# Patient Record
Sex: Male | Born: 1975 | Race: White | Hispanic: No | Marital: Married | Smoking: Never smoker
Health system: Southern US, Community
[De-identification: ages and names within clinical notes are randomized; demographics above are authoritative.]

## PROBLEM LIST (undated history)

## (undated) HISTORY — PX: NO PAST SURGERIES: SHX2092

---

## 2007-12-29 ENCOUNTER — Ambulatory Visit: Payer: Self-pay | Admitting: Internal Medicine

## 2015-08-13 ENCOUNTER — Ambulatory Visit
Admission: EM | Admit: 2015-08-13 | Discharge: 2015-08-13 | Disposition: A | Discharge status: Home / Self Care | Payer: BC Managed Care – PPO | Attending: Family Medicine | Admitting: Family Medicine

## 2015-08-13 ENCOUNTER — Ambulatory Visit: Payer: BC Managed Care – PPO

## 2015-08-13 ENCOUNTER — Encounter: Payer: Self-pay | Admitting: Emergency Medicine

## 2015-08-13 DIAGNOSIS — S61411A Laceration without foreign body of right hand, initial encounter: Secondary | ICD-10-CM

## 2015-08-13 MED ORDER — TETANUS-DIPHTH-ACELL PERTUSSIS 5-2.5-18.5 LF-MCG/0.5 IM SUSP
0.5000 mL | Freq: Once | INTRAMUSCULAR | Status: AC
  Administered 2015-08-13: 0.5 mL via INTRAMUSCULAR

## 2015-08-13 NOTE — Discharge Instructions (Signed)
Laceration Care, Adult °A laceration is a cut or lesion that goes through all layers of the skin and into the tissue just beneath the skin. °TREATMENT  °Some lacerations may not require closure. Some lacerations may not be able to be closed due to an increased risk of infection. It is important to see your caregiver as soon as possible after an injury to minimize the risk of infection and maximize the opportunity for successful closure. °If closure is appropriate, pain medicines may be given, if needed. The wound will be cleaned to help prevent infection. Your caregiver will use stitches (sutures), staples, wound glue (adhesive), or skin adhesive strips to repair the laceration. These tools bring the skin edges together to allow for faster healing and a better cosmetic outcome. However, all wounds will heal with a scar. Once the wound has healed, scarring can be minimized by covering the wound with sunscreen during the day for 1 full year. °HOME CARE INSTRUCTIONS  °For sutures or staples: °· Keep the wound clean and dry. °· If you were given a bandage (dressing), you should change it at least once a day. Also, change the dressing if it becomes wet or dirty, or as directed by your caregiver. °· Wash the wound with soap and water 2 times a day. Rinse the wound off with water to remove all soap. Pat the wound dry with a clean towel. °· After cleaning, apply a thin layer of the antibiotic ointment as recommended by your caregiver. This will help prevent infection and keep the dressing from sticking. °· You may shower as usual after the first 24 hours. Do not soak the wound in water until the sutures are removed. °· Only take over-the-counter or prescription medicines for pain, discomfort, or fever as directed by your caregiver. °· Get your sutures or staples removed as directed by your caregiver. °For skin adhesive strips: °· Keep the wound clean and dry. °· Do not get the skin adhesive strips wet. You may bathe  carefully, using caution to keep the wound dry. °· If the wound gets wet, pat it dry with a clean towel. °· Skin adhesive strips will fall off on their own. You may trim the strips as the wound heals. Do not remove skin adhesive strips that are still stuck to the wound. They will fall off in time. °For wound adhesive: °· You may briefly wet your wound in the shower or bath. Do not soak or scrub the wound. Do not swim. Avoid periods of heavy perspiration until the skin adhesive has fallen off on its own. After showering or bathing, gently pat the wound dry with a clean towel. °· Do not apply liquid medicine, cream medicine, or ointment medicine to your wound while the skin adhesive is in place. This may loosen the film before your wound is healed. °· If a dressing is placed over the wound, be careful not to apply tape directly over the skin adhesive. This may cause the adhesive to be pulled off before the wound is healed. °· Avoid prolonged exposure to sunlight or tanning lamps while the skin adhesive is in place. Exposure to ultraviolet light in the first year will darken the scar. °· The skin adhesive will usually remain in place for 5 to 10 days, then naturally fall off the skin. Do not pick at the adhesive film. °You may need a tetanus shot if: °· You cannot remember when you had your last tetanus shot. °· You have never had a tetanus   shot. If you get a tetanus shot, your arm may swell, get red, and feel warm to the touch. This is common and not a problem. If you need a tetanus shot and you choose not to have one, there is a rare chance of getting tetanus. Sickness from tetanus can be serious. SEEK MEDICAL CARE IF:   You have redness, swelling, or increasing pain in the wound.  You see a red line that goes away from the wound.  You have yellowish-white fluid (pus) coming from the wound.  You have a fever.  You notice a bad smell coming from the wound or dressing.  Your wound breaks open before or  after sutures have been removed.  You notice something coming out of the wound such as wood or glass.  Your wound is on your hand or foot and you cannot move a finger or toe. SEEK IMMEDIATE MEDICAL CARE IF:   Your pain is not controlled with prescribed medicine.  You have severe swelling around the wound causing pain and numbness or a change in color in your arm, hand, leg, or foot.  Your wound splits open and starts bleeding.  You have worsening numbness, weakness, or loss of function of any joint around or beyond the wound.  You develop painful lumps near the wound or on the skin anywhere on your body. MAKE SURE YOU:   Understand these instructions.  Will watch your condition.  Will get help right away if you are not doing well or get worse. Document Released: 12/03/2005 Document Revised: 02/25/2012 Document Reviewed: 05/29/2011 [facility] Patient Information 2015 [place], [place]. This information is not intended to replace advice given to you by your health care provider. Make sure you discuss any questions you have with your health care provider.   Follow up 7-10 days for suture removal

## 2015-08-13 NOTE — ED Notes (Signed)
Laceration on top of right hand. Was fixing dryer and was pulling wire underneath and something popped his hand.

## 2015-08-13 NOTE — ED Provider Notes (Signed)
CSN: [id number]     Arrival date & time 08/13/15  1328 History   First MD Initiated Contact with Patient 08/13/15 1422     Chief Complaint  Patient presents with  . Laceration   (Consider location/radiation/quality/duration/timing/severity/associated sxs/prior Treatment) HPI Comments: 39 yo male with a c/o a laceration to the top of his right hand. States was fixing dryer and cut his hand with a wire. States he can move his fingers. Denies numbness/tingling. Unsure of date of last tetanus vaccine. Otherwise healthy.  The history is provided by the patient.    History reviewed. No pertinent past medical history. Past Surgical History  Procedure Laterality Date  . No past surgeries     No family history on file. Social History  Substance Use Topics  . Smoking status: Never Smoker   . Smokeless tobacco: Never Used  . Alcohol Use: Yes     Comment: occasional    Review of Systems  Allergies  Review of patient's allergies indicates no known allergies.  Home Medications   Prior to Admission medications   Not on File   Meds Ordered and Administered this Visit   Medications  Tdap (BOOSTRIX) injection 0.5 mL (0.5 mLs Intramuscular Given 08/13/15 1442)    BP 123/92 mmHg  Pulse 62  Temp(Src) 97.9 F (36.6 C) (Tympanic)  Resp 18  Ht  (1.753 m)  Wt 160 lb (72.576 kg)  BMI 23.62 kg/m2  SpO2 99% No data found.   Physical Exam  Constitutional: He appears well-developed and well-nourished. No distress.  Musculoskeletal:       Right hand: He exhibits tenderness and laceration. He exhibits normal range of motion, no bony tenderness, normal two-point discrimination, normal capillary refill and no deformity. Normal sensation noted. Normal strength noted.       Hands: approx 2.5cm laceration on the dorsum of the hand over 1cm proximal to the 4th MCP joint; normal flexion and extension of finger; normal strength of finger; no foreign bodies visualized; no apparent tendon  involvement  Skin: He is not diaphoretic.  Nursing note and vitals reviewed.   ED Course  Procedures (including critical care time)  Labs Review Labs Reviewed - No data to display  Imaging Review Dg Finger Ring Right  08/13/2015   CLINICAL DATA:  Contusion and laceration to knuckle of right ring finger after working on dryer today at home. No previous. Previous fracture to right pinky finger 20+ years ago.  EXAM: RIGHT RING FINGER 2+V  COMPARISON:  None.  FINDINGS: No fracture. Joints normally spaced and aligned. Soft tissues unremarkable.  IMPRESSION: Negative.   Electronically Signed   By: Amie Portland M.D.   On: 08/13/2015 14:40     Visual Acuity Review  Right Eye Distance:   Left Eye Distance:   Bilateral Distance:    Right Eye Near:   Left Eye Near:    Bilateral Near:         MDM   1. Laceration of hand, right, initial encounter    Plan: 1. x-ray results (negative for fracture) and diagnosis reviewed with patient  2. Wound cleaned, inspected (no foreign bodies seen) and prepped in sterile fashion; anesthetized with 1% lidocaine; wound repaired  with 5 simple  interrupted sutures with 5.0 Nylon; patient tolerated procedure well; wound dressed 3. Patient given tetanus vaccine in clinic 4. Recommend wound care; information given 5. F/u in 7-10 days for suture removal or sooner prn if any problems  Payton Mccallum, MD 08/13/15 1614

## 2015-08-23 ENCOUNTER — Ambulatory Visit
Admission: EM | Admit: 2015-08-23 | Discharge: 2015-08-23 | Disposition: A | Discharge status: Home / Self Care | Payer: BC Managed Care – PPO | Attending: Family Medicine | Admitting: Family Medicine

## 2015-08-23 ENCOUNTER — Encounter: Payer: Self-pay | Admitting: Emergency Medicine

## 2015-08-23 DIAGNOSIS — S61411D Laceration without foreign body of right hand, subsequent encounter: Secondary | ICD-10-CM | POA: Diagnosis not present

## 2015-08-23 DIAGNOSIS — Z4801 Encounter for change or removal of surgical wound dressing: Secondary | ICD-10-CM

## 2015-08-23 DIAGNOSIS — M25641 Stiffness of right hand, not elsewhere classified: Secondary | ICD-10-CM

## 2015-08-23 NOTE — ED Notes (Signed)
Patient here for suture removal on right hand.  Patient laceration healed well with no signs of infection.  Patient reports that he is unable to raise his right 4th finger.

## 2015-08-23 NOTE — ED Provider Notes (Signed)
CSN: [id number]     Arrival date & time 08/23/15  0705 History   First MD Initiated Contact with Patient 08/23/15 [phone]     Chief Complaint  Patient presents with  . Suture / Staple Removal   (Consider location/radiation/quality/duration/timing/severity/associated sxs/prior Treatment) HPI Comments: 39 yo male s/p laceration to dorsal aspect of right hand over the 4th finger, here for suture removal. States skin area has been healing well. No drainage,redness, fevers, chills. However patient states still has some tenderness and can not fully extend his 4th finger. Patient returned to work after initial injury and does a lot of typing. States by the end of the day the finger is hurting more. He also states that it seems to him the range of motion (extension) seems to be improving on a daily basis.   Patient is a 39 y.o. male presenting with suture removal. The history is provided by the patient.  Suture / Staple Removal    History reviewed. No pertinent past medical history. Past Surgical History  Procedure Laterality Date  . No past surgeries     History reviewed. No pertinent family history. Social History  Substance Use Topics  . Smoking status: Never Smoker   . Smokeless tobacco: Never Used  . Alcohol Use: Yes     Comment: occasional    Review of Systems  Allergies  Review of patient's allergies indicates no known allergies.  Home Medications   Prior to Admission medications   Not on File   Meds Ordered and Administered this Visit  Medications - No data to display  Temp(Src) 97 F (36.1 C) (Tympanic) No data found.   Physical Exam  Constitutional: He appears well-developed and well-nourished. No distress.  Cardiovascular: Intact distal pulses.   Musculoskeletal: He exhibits no edema.  Right hand neurovascularly intact; no skin erythema, edema or drainage; skin laceration healed; slight flexed resting position of 4th finger compared to the other digits and mild  limitation to full extension of the 4th finger noted; mild tenderness to palpation over the wound area  Skin: No rash noted. He is not diaphoretic. No erythema.  Nursing note and vitals reviewed.   ED Course  Procedures (including critical care time)  Labs Review Labs Reviewed - No data to display  Imaging Review No results found.   Visual Acuity Review  Right Eye Distance:   Left Eye Distance:   Bilateral Distance:    Right Eye Near:   Left Eye Near:    Bilateral Near:         MDM   1. Laceration of hand, right, subsequent encounter   2. Limitation of joint motion of hand, right            (progressively improving)  Plan: 1. diagnosis reviewed with patient; laceration healed; sutures removed 2. Recommend supportive treatment with otc NSAID and range of motion exercises of right hand 4th finger 3. Referral to hand orthopedic specialist for further evaluation  4. F/u prn   Payton Mccallum, MD 08/23/15 [phone]

## 2015-08-30 ENCOUNTER — Telehealth: Payer: Self-pay

## 2015-08-30 NOTE — ED Notes (Addendum)
Pt contacted as appt with Dr. Jarold Motto (hand ortho) at [facility] made for Friday 9/16 at 9:30am. Pt appreciative of care. Work and Cell numbers updated in chart. Notes faxed to [facility]) as they can not view the referral in EPIC.

## 2016-07-11 IMAGING — CR DG FINGER RING 2+V*R*
3 series · 3 of 3 positions shown · non-contrast
Comparison: None.

CLINICAL DATA: Contusion and laceration to knuckle of right ring
finger after working on dryer today at home. No previous. Previous
fracture to right pinky finger 20+ years ago.

EXAM:
RIGHT RING FINGER 2+V

[finger ap]
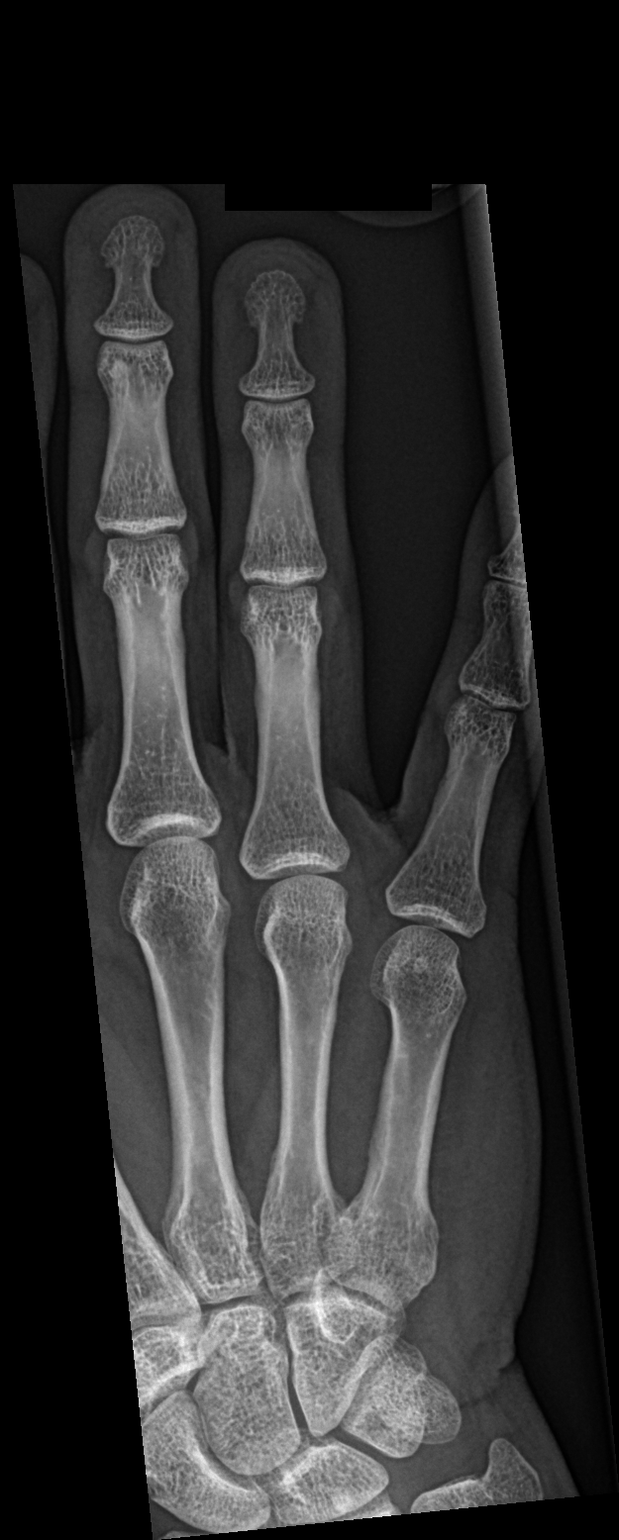

[finger obl]
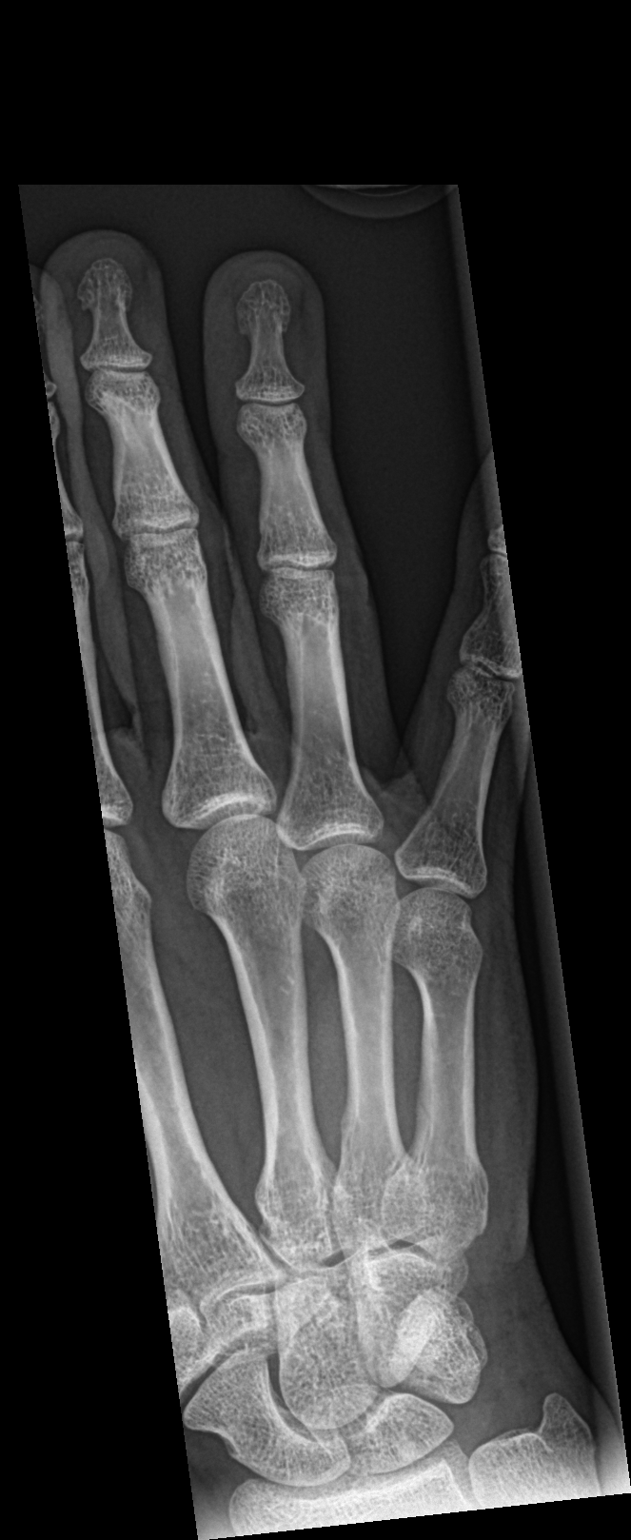

[finger lat]
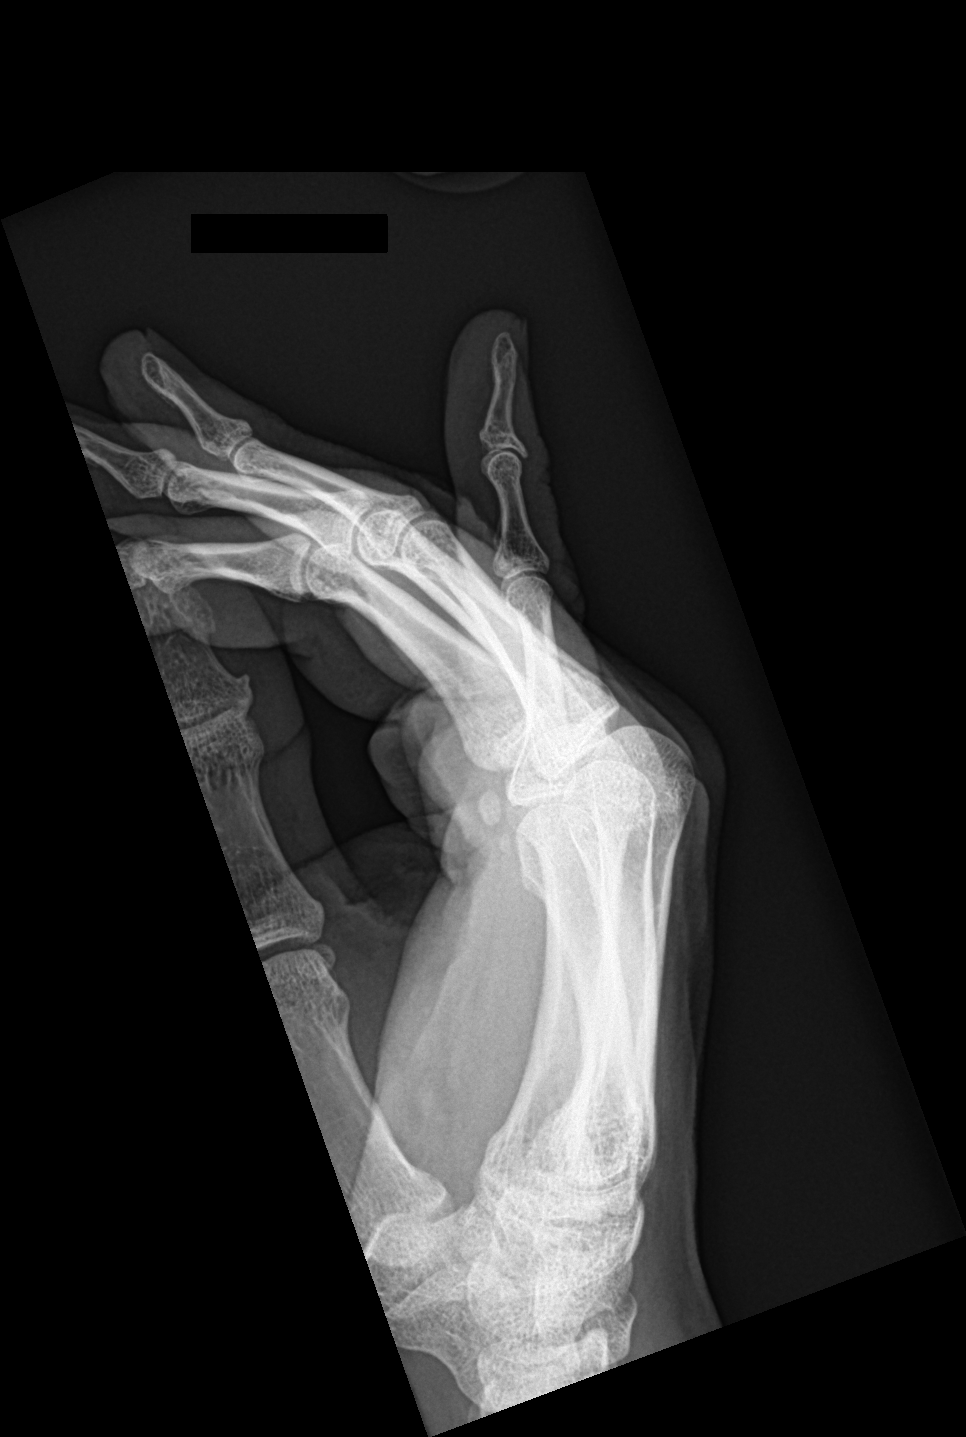

[3 of 3 positions shown; findings below may reference images not displayed]

FINDINGS: No fracture. Joints normally spaced and aligned. Soft tissues
unremarkable.
IMPRESSION: Negative.
